# Patient Record
Sex: Female | Born: 1995 | Race: Black or African American | Hispanic: No | Marital: Single | Smoking: Never smoker
Health system: Southern US, Community
[De-identification: ages and names within clinical notes are randomized; demographics above are authoritative.]

## PROBLEM LIST (undated history)

## (undated) DIAGNOSIS — F419 Anxiety disorder, unspecified: Secondary | ICD-10-CM

## (undated) DIAGNOSIS — J45909 Unspecified asthma, uncomplicated: Secondary | ICD-10-CM

## (undated) HISTORY — PX: TONSILLECTOMY: SUR1361

## (undated) HISTORY — PX: HERNIA REPAIR: SHX51

## (undated) HISTORY — PX: ADENOIDECTOMY: SHX5191

## (undated) HISTORY — PX: HAND SURGERY: SHX662

## (undated) HISTORY — PX: TYMPANOSTOMY TUBE PLACEMENT: SHX32

---
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
## 2020-07-30 ENCOUNTER — Emergency Department (HOSPITAL_BASED_OUTPATIENT_CLINIC_OR_DEPARTMENT_OTHER)
Admission: EM | Admit: 2020-07-30 | Discharge: 2020-07-31 | Disposition: A | Discharge status: Home / Self Care | Payer: BC Managed Care – PPO | Attending: Emergency Medicine | Admitting: Emergency Medicine

## 2020-07-30 ENCOUNTER — Emergency Department (HOSPITAL_BASED_OUTPATIENT_CLINIC_OR_DEPARTMENT_OTHER): Payer: BC Managed Care – PPO

## 2020-07-30 ENCOUNTER — Other Ambulatory Visit: Payer: Self-pay

## 2020-07-30 ENCOUNTER — Encounter (HOSPITAL_BASED_OUTPATIENT_CLINIC_OR_DEPARTMENT_OTHER): Payer: Self-pay | Admitting: *Deleted

## 2020-07-30 DIAGNOSIS — J029 Acute pharyngitis, unspecified: Secondary | ICD-10-CM | POA: Diagnosis not present

## 2020-07-30 DIAGNOSIS — B370 Candidal stomatitis: Secondary | ICD-10-CM | POA: Diagnosis not present

## 2020-07-30 DIAGNOSIS — J45909 Unspecified asthma, uncomplicated: Secondary | ICD-10-CM | POA: Insufficient documentation

## 2020-07-30 DIAGNOSIS — H9203 Otalgia, bilateral: Secondary | ICD-10-CM | POA: Insufficient documentation

## 2020-07-30 LAB — CBC WITH DIFFERENTIAL/PLATELET
Abs Immature Granulocytes: 0.08 10*3/uL — ABNORMAL HIGH (ref 0.00–0.07)
Basophils Absolute: 0 10*3/uL (ref 0.0–0.1)
Basophils Relative: 0 %
Eosinophils Absolute: 0 10*3/uL (ref 0.0–0.5)
Eosinophils Relative: 0 %
HCT: 43.9 % (ref 36.0–46.0)
Hemoglobin: 14.6 g/dL (ref 12.0–15.0)
Immature Granulocytes: 1 %
Lymphocytes Relative: 26 %
Lymphs Abs: 4.1 10*3/uL — ABNORMAL HIGH (ref 0.7–4.0)
MCH: 28.4 pg (ref 26.0–34.0)
MCHC: 33.3 g/dL (ref 30.0–36.0)
MCV: 85.4 fL (ref 80.0–100.0)
Monocytes Absolute: 1.5 10*3/uL — ABNORMAL HIGH (ref 0.1–1.0)
Monocytes Relative: 9 %
Neutro Abs: 10.3 10*3/uL — ABNORMAL HIGH (ref 1.7–7.7)
Neutrophils Relative %: 64 %
Platelets: 315 10*3/uL (ref 150–400)
RBC: 5.14 MIL/uL — ABNORMAL HIGH (ref 3.87–5.11)
RDW: 14.4 % (ref 11.5–15.5)
WBC: 16 10*3/uL — ABNORMAL HIGH (ref 4.0–10.5)
nRBC: 0 % (ref 0.0–0.2)

## 2020-07-30 LAB — BASIC METABOLIC PANEL
Anion gap: 8 (ref 5–15)
BUN: 13 mg/dL (ref 6–20)
CO2: 26 mmol/L (ref 22–32)
Calcium: 9.3 mg/dL (ref 8.9–10.3)
Chloride: 101 mmol/L (ref 98–111)
Creatinine, Ser: 0.78 mg/dL (ref 0.44–1.00)
GFR, Estimated: >60 mL/min (ref >60)
Glucose, Bld: 84 mg/dL (ref 70–99)
Potassium: 3.9 mmol/L (ref 3.5–5.1)
Sodium: 135 mmol/L (ref 135–145)

## 2020-07-30 LAB — HCG, SERUM, QUALITATIVE: Preg, Serum: NEGATIVE

## 2020-07-30 LAB — MONONUCLEOSIS SCREEN: Mono Screen: NEGATIVE

## 2020-07-30 MED ORDER — LIDOCAINE VISCOUS HCL 2 % MT SOLN: 5.0000 mL | Freq: Three times a day (TID) | OROMUCOSAL | 0 refills | Status: AC

## 2020-07-30 MED ORDER — DEXAMETHASONE SODIUM PHOSPHATE 10 MG/ML IJ SOLN
10.0000 mg | Freq: Once | INTRAMUSCULAR | Status: AC
  Administered 2020-07-30: 10 mg via INTRAVENOUS
  Filled 2020-07-30: qty 1

## 2020-07-30 MED ORDER — ACETAMINOPHEN 325 MG PO TABS
650.0000 mg | ORAL_TABLET | Freq: Once | ORAL | Status: AC
  Administered 2020-07-30: 650 mg via ORAL
  Filled 2020-07-30: qty 2

## 2020-07-30 MED ORDER — CLINDAMYCIN PHOSPHATE 600 MG/50ML IV SOLN
600.0000 mg | Freq: Once | INTRAVENOUS | Status: AC
  Administered 2020-07-30: 600 mg via INTRAVENOUS
  Filled 2020-07-30: qty 50

## 2020-07-30 MED ORDER — FLUCONAZOLE 200 MG PO TABS: 200.0000 mg | ORAL_TABLET | Freq: Every day | ORAL | 0 refills | Status: AC

## 2020-07-30 MED ORDER — IOHEXOL 300 MG/ML  SOLN
75.0000 mL | Freq: Once | INTRAMUSCULAR | Status: AC | PRN
  Administered 2020-07-30: 75 mL via INTRAVENOUS

## 2020-07-30 MED ORDER — MORPHINE SULFATE (PF) 4 MG/ML IV SOLN
4.0000 mg | Freq: Once | INTRAVENOUS | Status: AC
  Administered 2020-07-30: 4 mg via INTRAVENOUS
  Filled 2020-07-30: qty 1

## 2020-07-30 MED ORDER — CLINDAMYCIN HCL 300 MG PO CAPS: 300.0000 mg | ORAL_CAPSULE | Freq: Three times a day (TID) | ORAL | 0 refills | Status: AC

## 2020-07-30 NOTE — ED Notes (Signed)
Pt. Has had Covid swab on Monday was negative

## 2020-07-30 NOTE — Discharge Instructions (Addendum)
Take the mediations as prescribed. Stop the Cefdinir   Return for new or worsening symptoms

## 2020-07-30 NOTE — ED Notes (Signed)
Pt. Had cell phone on when RN entered room RN asked Pt. To turn phone off and type on phone what was going on and the test that have been done.  Pt. Has been seen at [facility] on July 4 with strep negative and placed on abx still taking and pain meds.  Pt. Has no voice with gone and only able to take small sips of fluid.  Pt. Reports by typing feels like swallowing razor blades and fire.

## 2020-07-30 NOTE — ED Provider Notes (Signed)
[facility] EMERGENCY DEPARTMENT Provider Note   CSN: [id number] Arrival date & time: 07/30/20  1832     History Chief Complaint  Patient presents with   Sore Throat   Otalgia    Rhonda Mccoy is a 25 y.o. female with past medical history significant for asthma who presents for evaluation of sore throat.  Symptoms began on Monday.  Was seen by urgent care.  Had negative strep, COVID test.  Has had worsening pain.  Pain worse with tolerating p.o. intake.  She does not want to talk due to the pain.  She has had no neck stiffness or neck rigidity.  No shortness of breath, CP.  Was recently started a few weeks ago on inhaled corticosteroid for her asthma.  She has no concern for intraoral STDs.  She is s/p tonsillectomy.  Patient was started on cefdinir, steroids, viscous lidocaine. Noted white patches on back of throat. States she has not had relief with these medications.  She is taken these medications for 3 days.  Rates her pain a 9/10.  Denies additional aggravating associated or  Cabbell intact distally relieving factors.  She denies any drooling, dysphagia or trismus.  No facial swelling.  No fever, chills, nausea or vomiting. No sick contacts. Up to date on immunizations.  History obtained from patient and past medical records.  No interpreter used.  HPI     Past Medical History:  Diagnosis Date   Anxiety    Asthma     There are no problems to display for this patient.   Past Surgical History:  Procedure Laterality Date   ADENOIDECTOMY     HAND SURGERY     HERNIA REPAIR     TONSILLECTOMY     TYMPANOSTOMY TUBE PLACEMENT       OB History   No obstetric history on file.     No family history on file.  Social History   Tobacco Use   Smoking status: Never   Smokeless tobacco: Never  Vaping Use   Vaping Use: Never used  Substance Use Topics   Alcohol use: No   Drug use: No    Home Medications Prior to Admission medications   Medication Sig Start  Date End Date Taking? Authorizing Provider  clindamycin (CLEOCIN) 300 MG capsule Take 1 capsule (300 mg total) by mouth 3 (three) times daily for 7 days. 07/30/20 08/06/20 Yes Vicy Medico A, PA-C  fluconazole (DIFLUCAN) 200 MG tablet Take 1 tablet (200 mg total) by mouth daily for 7 days. 07/30/20 08/06/20 Yes Shellia Hartl A, PA-C  magic mouthwash (lidocaine, diphenhydrAMINE, alum & mag hydroxide) suspension Swish and swallow 5 mLs 3 (three) times daily. 07/30/20 08/29/20 Yes Darric Plante A, PA-C    Allergies    Amoxicillin and Penicillins  Review of Systems   Review of Systems  Constitutional: Negative.   HENT:  Positive for sore throat. Negative for congestion, drooling, ear discharge, facial swelling, hearing loss, mouth sores, nosebleeds, postnasal drip, rhinorrhea, sinus pressure, sinus pain, sneezing, trouble swallowing and voice change.   Respiratory: Negative.    Cardiovascular: Negative.   Gastrointestinal: Negative.   Genitourinary: Negative.   Musculoskeletal: Negative.   Skin: Negative.   Neurological: Negative.   All other systems reviewed and are negative.  Physical Exam Updated Vital Signs BP 113/63   Pulse 60   Temp 99 F (37.2 C) (Oral)   Resp 20   Ht 5' (1.524 m)   Wt 105.8 kg   LMP 07/03/2020  SpO2 100%   BMI 45.54 kg/m   Physical Exam Vitals and nursing note reviewed.  Constitutional:      General: She is not in acute distress.    Appearance: She is well-developed. She is not ill-appearing, toxic-appearing or diaphoretic.  HENT:     Head: Normocephalic and atraumatic.     Jaw: There is normal jaw occlusion.     Comments: No drooling, dysphagia or trismus  No facial edema.  Sub mandibular area soft    Right Ear: Tympanic membrane and ear canal normal.     Left Ear: Tympanic membrane and ear canal normal.     Nose: No congestion or rhinorrhea.     Mouth/Throat:     Lips: Pink.     Mouth: Mucous membranes are moist.     Palate: No mass and  lesions.     Pharynx: Uvula midline. Oropharyngeal exudate, posterior oropharyngeal erythema and uvula swelling present.     Tonsils: No tonsillar exudate or tonsillar abscesses. 0 on the right. 0 on the left.     Comments: Posterior oropharyngeal erythema with white, scrappable exudate to tongue as well as posterior oropharynx.  Uvula midline with some mild swelling however no deviation.  No evidence of PTA or RPA.  No visualized tonsils likely due to prior surgical intervention. Eyes:     Pupils: Pupils are equal, round, and reactive to light.  Neck:     Trachea: Trachea and phonation normal.     Comments: No neck stiffness or neck rigidity Cardiovascular:     Rate and Rhythm: Normal rate.     Pulses: Normal pulses.     Heart sounds: Normal heart sounds.  Pulmonary:     Effort: Pulmonary effort is normal. No respiratory distress.     Breath sounds: Normal breath sounds and air entry.  Abdominal:     General: There is no distension.  Musculoskeletal:        General: Normal range of motion.     Cervical back: Full passive range of motion without pain and normal range of motion.  Skin:    General: Skin is warm and dry.  Neurological:     General: No focal deficit present.     Mental Status: She is alert.  Psychiatric:        Mood and Affect: Mood normal.    ED Results / Procedures / Treatments   Labs (all labs ordered are listed, but only abnormal results are displayed) Labs Reviewed  CBC WITH DIFFERENTIAL/PLATELET - Abnormal; Notable for the following components:      Result Value   WBC 16.0 (*)    RBC 5.14 (*)    Neutro Abs 10.3 (*)    Lymphs Abs 4.1 (*)    Monocytes Absolute 1.5 (*)    Abs Immature Granulocytes 0.08 (*)    All other components within normal limits  BASIC METABOLIC PANEL  MONONUCLEOSIS SCREEN  HCG, SERUM, QUALITATIVE  GC/CHLAMYDIA PROBE AMP ([place]) NOT AT [facility]    EKG None  Radiology CT Soft Tissue Neck W Contrast  Result Date:  07/30/2020 CLINICAL DATA:  Initial evaluation for acute sore throat, bilateral ear pain. History of prior adenoidectomy, tonsillectomy. EXAM: CT NECK WITH CONTRAST TECHNIQUE: Multidetector CT imaging of the neck was performed using the standard protocol following the bolus administration of intravenous contrast. CONTRAST:  10mL OMNIPAQUE IOHEXOL 300 MG/ML  SOLN COMPARISON:  None. FINDINGS: Pharynx and larynx: Oral cavity within normal limits. No acute inflammatory changes seen  about the dentition. Oropharynx and nasopharynx demonstrate no acute finding. No retropharyngeal swelling or collection. Epiglottis normal. Vallecula clear. Remainder of the hypopharynx and supraglottic larynx within normal limits. Glottis closed and not well assessed. Subglottic airway patent clear. Salivary glands: Salivary glands including the parotid and submandibular glands are normal. Thyroid: Normal. Lymph nodes: No enlarged or pathologic adenopathy seen within the neck. Vascular: Normal intravascular enhancement seen throughout the neck. Limited intracranial: Unremarkable. Visualized orbits: Unremarkable. Mastoids and visualized paranasal sinuses: Visualized paranasal sinuses are clear. Visualized mastoid air cells and middle ear cavities are well pneumatized and free of fluid. Skeleton: No visible acute osseous finding. No discrete or worrisome osseous lesions. Upper chest: Visualized upper chest demonstrates no acute finding. Partially visualized lungs are clear. Other: None. IMPRESSION: Normal CT of the neck. No acute inflammatory changes or other abnormality identified. Electronically Signed   By: Rise MuBenjamin  McClintock M.D.   On: 07/30/2020 23:21    Procedures Procedures   Medications Ordered in ED Medications  acetaminophen (TYLENOL) tablet 650 mg (650 mg Oral Given 07/30/20 2110)  dexamethasone (DECADRON) injection 10 mg (10 mg Intravenous Given 07/30/20 2233)  morphine 4 MG/ML injection 4 mg (4 mg Intravenous Given 07/30/20  2233)  iohexol (OMNIPAQUE) 300 MG/ML solution 75 mL (75 mLs Intravenous Contrast Given 07/30/20 2248)  clindamycin (CLEOCIN) IVPB 600 mg (0 mg Intravenous Stopped 07/30/20 2338)    ED Course  I have reviewed the triage vital signs and the nursing notes.  Pertinent labs & imaging results that were available during my care of the patient were reviewed by me and considered in my medical decision making (see chart for details).  Here for evaluation of sore throat.  She is afebrile, nonseptic, non-ill-appearing.  No evidence of PTA or RPA.  She has no neck stiffness or neck rigidity.  Sublingual area soft.  Submandibular area without induration.  No evidence of Ludwig's angina.  She is s/p tonsillectomy many years ago.  Does have some mild uvula erythema and swelling however no deviation.  Has copious, thick white scrappable plaque to tongue as well as posterior oropharynx.  She is up-to-date immunizations, no recent travel.  Low suspicion for diphtheria. She denies any concerns for any intraoral STDs.  She is tolerating her secretions.  Strep, COVID flu outpatient negative  CBC with leukocytosis at 16.0, patient has been on steroids recently Mono negative Pregnancy test negative BMP without electrolyte, renal abnormality CT soft tissue neck without infectious process.  Unclear etiology of symptoms.  Will treat symptomatically.  Given she did recently start a few weeks ago inhaled corticosteroids, question if patient has thrush.  Will treat symptomatically and have her follow-up with ENT. Tolerating PO intake.  The patient has been appropriately medically screened and/or stabilized in the ED. I have low suspicion for any other emergent medical condition which would require further screening, evaluation or treatment in the ED or require inpatient management.  Patient is hemodynamically stable and in no acute distress.  Patient able to ambulate in department prior to ED.  Evaluation does not show acute  pathology that would require ongoing or additional emergent interventions while in the emergency department or further inpatient treatment.  I have discussed the diagnosis with the patient and answered all questions.  Pain is been managed while in the emergency department and patient has no further complaints prior to discharge.  Patient is comfortable with plan discussed in room and is stable for discharge at this time.  I have discussed strict return  precautions for returning to the emergency department.  Patient was encouraged to follow-up with PCP/specialist refer to at discharge.     MDM Rules/Calculators/A&P                           Final Clinical Impression(s) / ED Diagnoses Final diagnoses:  Sore throat  Oral yeast infection    Rx / DC Orders ED Discharge Orders          Ordered    clindamycin (CLEOCIN) 300 MG capsule  3 times daily        07/30/20 2359    magic mouthwash (lidocaine, diphenhydrAMINE, alum & mag hydroxide) suspension  3 times daily        07/30/20 2359    fluconazole (DIFLUCAN) 200 MG tablet  Daily        07/30/20 2359             Socrates Cahoon A, PA-C 07/31/20 0008    Benjiman Core, MD 07/31/20 1453

## 2020-07-30 NOTE — ED Triage Notes (Signed)
Pain in both of her ears and sore throat with exudate. She was seen by her MD x 2 with no relief. Flu and strep test are negative. Her Covid test is pending. She has been given antibiotics and pain pills. Lidocaine viscous is being used.

## 2020-07-31 ENCOUNTER — Telehealth (HOSPITAL_BASED_OUTPATIENT_CLINIC_OR_DEPARTMENT_OTHER): Payer: Self-pay | Admitting: *Deleted

## 2020-07-31 NOTE — Telephone Encounter (Signed)
Cytology called [facility] to report that GC/chamydia throat swab had been sent to them in wrong tube and they are unable to run test.   Called pt to inform her that she needs to return to ed or go to an urgent care to have these test repeated

## 2022-03-18 ENCOUNTER — Encounter (HOSPITAL_BASED_OUTPATIENT_CLINIC_OR_DEPARTMENT_OTHER): Payer: Self-pay | Admitting: Urology

## 2022-03-18 ENCOUNTER — Other Ambulatory Visit: Payer: Self-pay

## 2022-03-18 ENCOUNTER — Emergency Department (HOSPITAL_BASED_OUTPATIENT_CLINIC_OR_DEPARTMENT_OTHER)
Admission: EM | Admit: 2022-03-18 | Discharge: 2022-03-18 | Disposition: A | Discharge status: Home / Self Care | Payer: BC Managed Care – PPO | Attending: Emergency Medicine | Admitting: Emergency Medicine

## 2022-03-18 DIAGNOSIS — K047 Periapical abscess without sinus: Secondary | ICD-10-CM | POA: Insufficient documentation

## 2022-03-18 DIAGNOSIS — K029 Dental caries, unspecified: Secondary | ICD-10-CM | POA: Insufficient documentation

## 2022-03-18 MED ORDER — METRONIDAZOLE 500 MG PO TABS: 500.0000 mg | ORAL_TABLET | Freq: Two times a day (BID) | ORAL | 0 refills | Status: AC

## 2022-03-18 MED ORDER — HYDROCODONE-ACETAMINOPHEN 5-325 MG PO TABS: 1.0000 | ORAL_TABLET | Freq: Four times a day (QID) | ORAL | 0 refills | Status: AC | PRN

## 2022-03-18 MED ORDER — CEFDINIR 300 MG PO CAPS: 300.0000 mg | ORAL_CAPSULE | Freq: Two times a day (BID) | ORAL | 0 refills | Status: AC

## 2022-03-18 NOTE — ED Provider Notes (Signed)
[facility] Provider Note   CSN: [id number] Arrival date & time: 03/18/22  1239     History  Chief Complaint  Patient presents with   Dental Pain    Rhonda Mccoy is a 27 y.o. female, no pertinent past medical history, who presents to the ED secondary to right molar pain that is been going on for the last week and a half.  She states that she has had a little bit of bleeding of her mouth, and the tooth pain.  States that she tried to get into her dentist, but they will not see her until 3/14.  Denying any redness, does endorse a little bit of swelling of her right cheek.  Denies any ear pain, difficulty swallowing, or shortness of breath.    Home Medications Prior to Admission medications   Medication Sig Start Date End Date Taking? Authorizing Provider  cefdinir (OMNICEF) 300 MG capsule Take 1 capsule (300 mg total) by mouth 2 (two) times daily. 03/18/22  Yes Jackie Littlejohn L, PA  HYDROcodone-acetaminophen (NORCO) 5-325 MG tablet Take 1 tablet by mouth every 6 (six) hours as needed for moderate pain. 03/18/22  Yes Lakrista Scaduto L, PA  metroNIDAZOLE (FLAGYL) 500 MG tablet Take 1 tablet (500 mg total) by mouth 2 (two) times daily. 03/18/22  Yes Kinslee Dalpe L, PA      Allergies    Amoxicillin and Penicillins    Review of Systems   Review of Systems  HENT:  Positive for dental problem. Negative for ear pain and sore throat.     Physical Exam Updated Vital Signs BP (!) 129/91 (BP Location: Right Arm)   Pulse 63   Temp 99 F (37.2 C) (Oral)   Resp 18   Ht 5' (1.524 m)   Wt 103 kg   SpO2 97%   BMI 44.33 kg/m  Physical Exam Vitals and nursing note reviewed.  Constitutional:      General: She is not in acute distress.    Appearance: She is well-developed. She is not toxic-appearing.  HENT:     Head: Normocephalic and atraumatic.     Right Ear: Tympanic membrane normal. Tympanic membrane is not perforated, erythematous, retracted  or bulging.     Left Ear: Tympanic membrane normal. Tympanic membrane is not perforated, erythematous, retracted or bulging.     Nose: Nose normal.     Mouth/Throat:     Mouth: Mucous membranes are moist.     Dentition: Dental caries present.     Pharynx: Uvula midline. No oropharyngeal exudate, posterior oropharyngeal erythema or uvula swelling.     Tonsils: No tonsillar abscesses.      Comments: Carie as marked above, found and right molar.  Mild swelling of the gingiva, with erythema.  No fluctuance however.   Eyes:     General:        Right eye: No discharge.        Left eye: No discharge.     Conjunctiva/sclera: Conjunctivae normal.  Pulmonary:     Effort: No respiratory distress.  Musculoskeletal:     Cervical back: Normal range of motion and neck supple.  Lymphadenopathy:     Cervical: No cervical adenopathy.  Neurological:     Mental Status: She is alert.     Comments: Clear speech.   Psychiatric:        Behavior: Behavior normal.        Thought Content: Thought content normal.  ED Results / Procedures / Treatments   Labs (all labs ordered are listed, but only abnormal results are displayed) Labs Reviewed - No data to display  EKG None  Radiology No results found.  Procedures Procedures    Medications Ordered in ED Medications - No data to display  ED Course/ Medical Decision Making/ A&P                             Medical Decision Making Patient here for a dental pain that is been going on for the last week and a half.  Found to have likely carious in the right second molar, some irritation redness around the gums, no overt facial swelling.  Uvula midline.  No facial erythema.  I believe that she has a brewing dental infection secondary to her carie we will start her on a cephalosporin and metronidazole given that she has an infection and is allergic to penicillins.  She states she has a rash with penicillins.  Discussed return precautions, and sent a  short dose of Norco for severe pain as she has been taking Tylenol around every 3 hours without relief.  Discussed importance of only using 4 g Tylenol a day total.  Risk Prescription drug management.    Final Clinical Impression(s) / ED Diagnoses Final diagnoses:  Dental infection  Pain due to dental caries    Rx / DC Orders ED Discharge Orders          Ordered    HYDROcodone-acetaminophen (NORCO) 5-325 MG tablet  Every 6 hours PRN        03/18/22 1400    cefdinir (OMNICEF) 300 MG capsule  2 times daily        03/18/22 1400    metroNIDAZOLE (FLAGYL) 500 MG tablet  2 times daily        03/18/22 1400              Willodean Leven L, PA 03/18/22 1412    Leanord Asal K, DO 03/18/22 1428

## 2022-03-18 NOTE — Discharge Instructions (Addendum)
Please follow-up with your dentist, in the next week, I provided you with information for other resources for dental providers.  Return to the ER if you have swelling of your face, redness, difficulty swallowing.

## 2022-03-18 NOTE — ED Triage Notes (Signed)
Right lower dental pain and mild facial swelling x 3 days  Denies fever  States has dentist appointment on 3/14 but unable to be seen sooner

## 2023-01-08 IMAGING — CT CT NECK W/ CM
3 of 4 series · 13 of 33 positions shown, 16 images · IV contrast (omnipaque)
Comparison: None.

CLINICAL DATA: Initial evaluation for acute sore throat, bilateral
ear pain. History of prior adenoidectomy, tonsillectomy.

EXAM:
CT NECK WITH CONTRAST
TECHNIQUE: Multidetector CT imaging of the neck was performed using the
standard protocol following the bolus administration of intravenous
contrast.
CONTRAST:  75mL OMNIPAQUE IOHEXOL 300 MG/ML  SOLN

[Series 3: axial neck · axial · 0.46mm/px · z∈[-640,-512]mm · 5 of 97 slices shown, 7 images]
[im 17/97  soft-tissue]
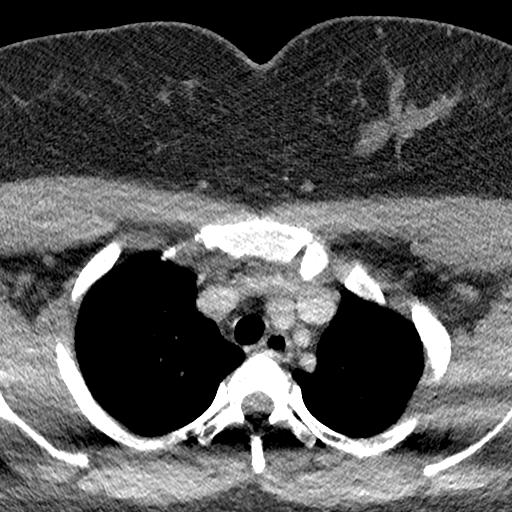
[im 17/97  bone]
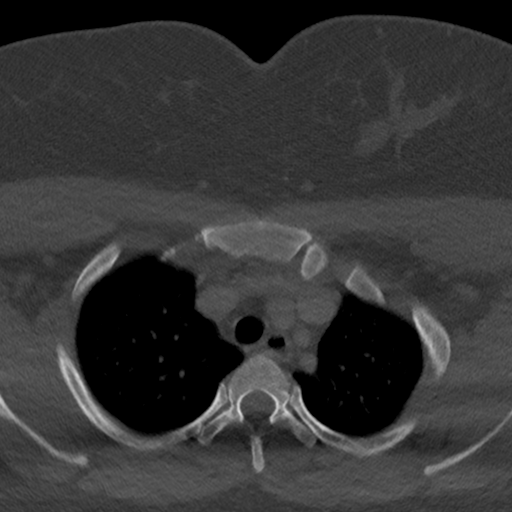
[im 33/97  bone]
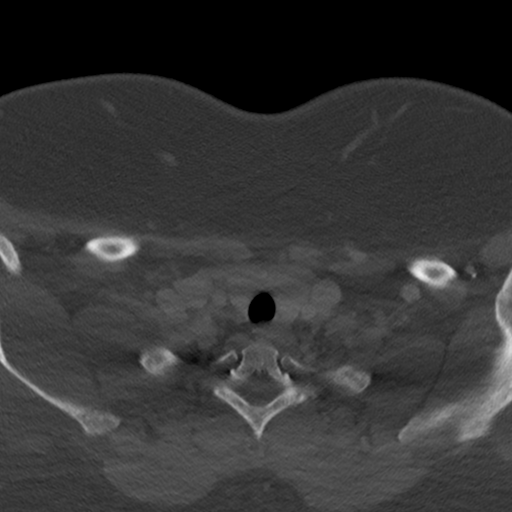
[im 49/97  bone]
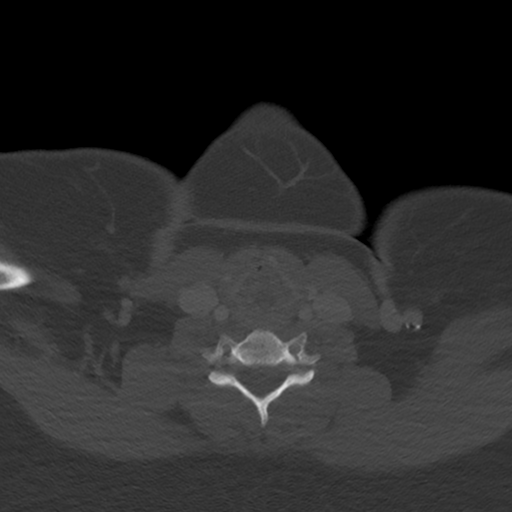
[im 65/97  bone]
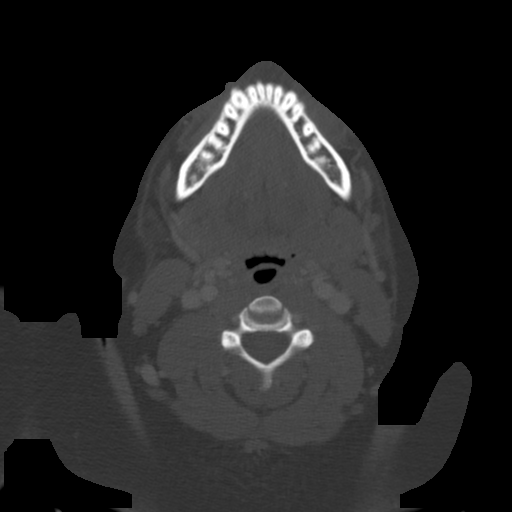
[im 81/97  soft-tissue]
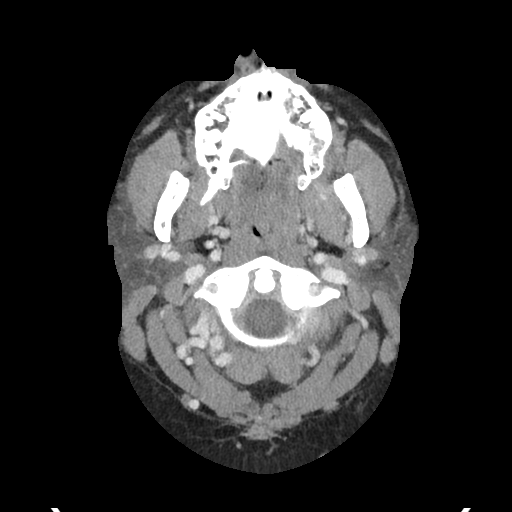
[im 81/97  bone]
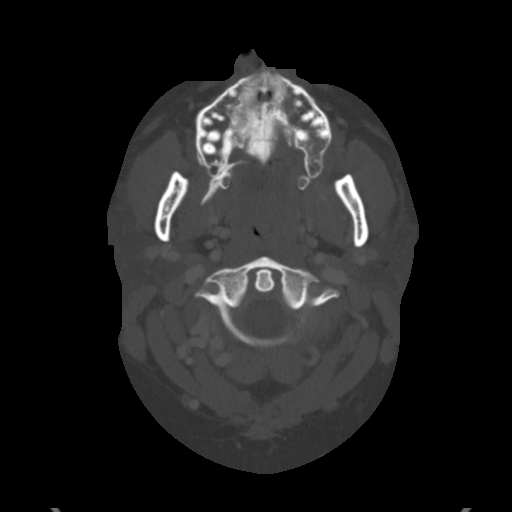

[Series 6: sag neck · sagittal · 0.39mm/px · 5 of 101 slices shown, 6 images]
[im 34/101  bone]
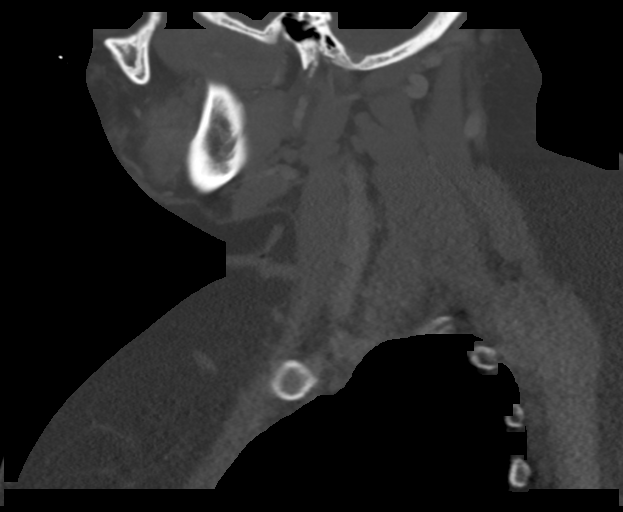
[im 42/101  bone]
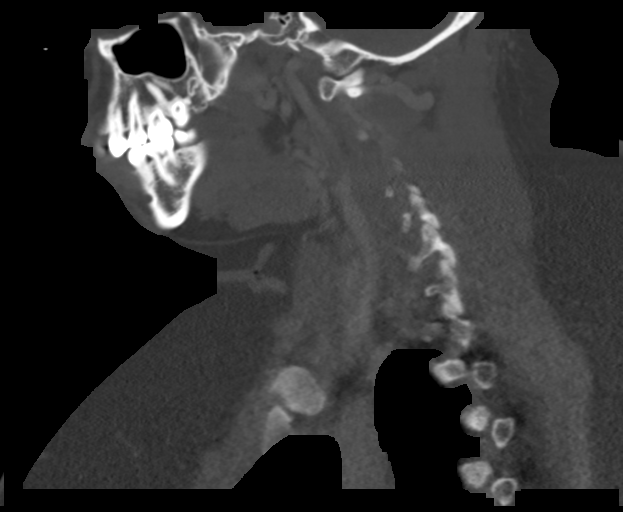
[im 51/101  soft-tissue]
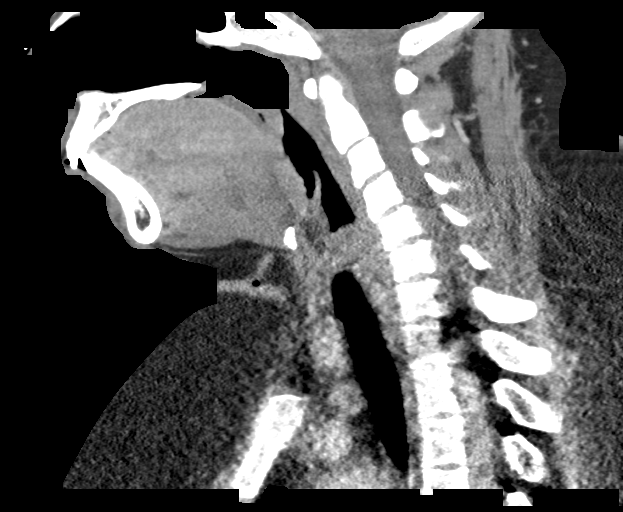
[im 51/101  bone]
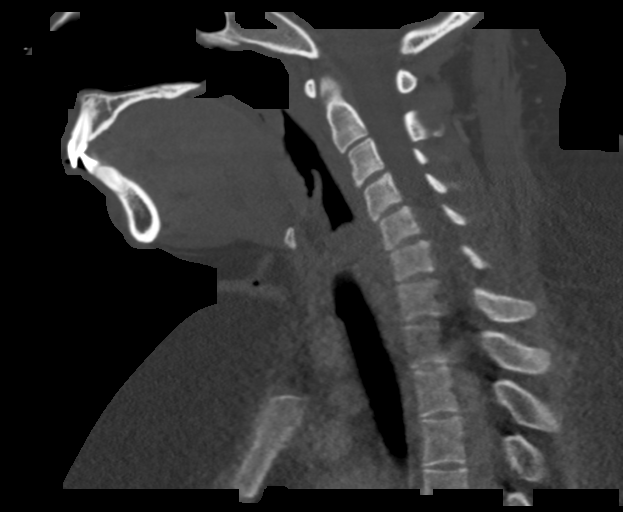
[im 59/101  bone]
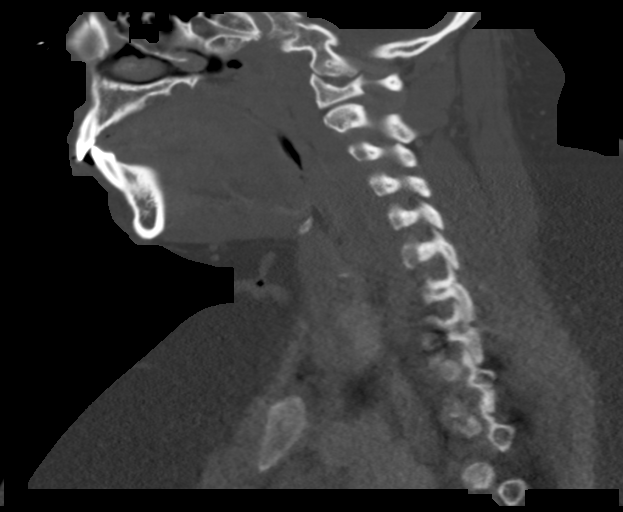
[im 67/101  bone]
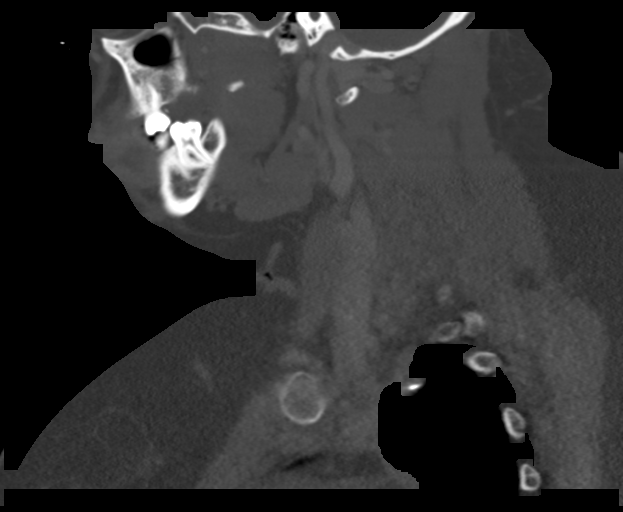

[Series 7: cor neck · coronal · 0.38mm/px · 3 of 118 slices shown]
[im 24/118  bone]
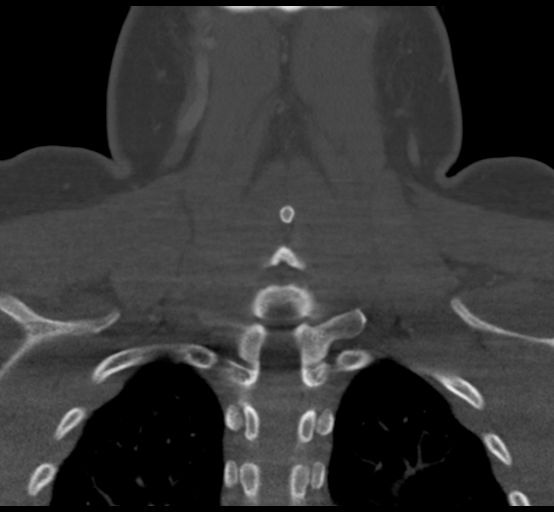
[im 47/118  bone]
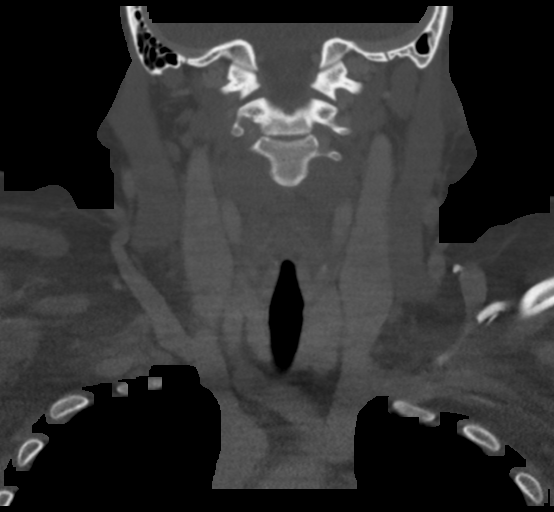
[im 71/118  bone]
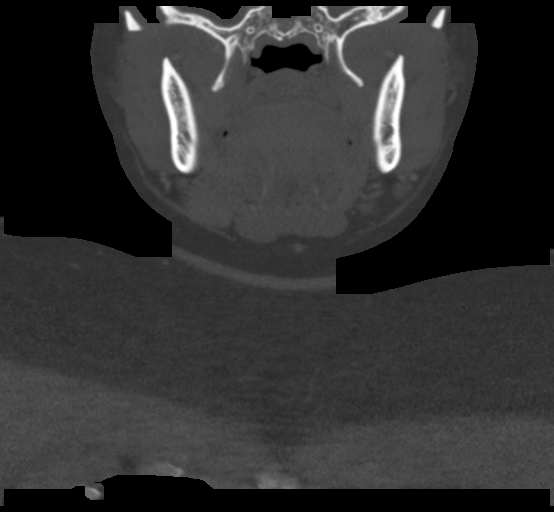

[13 of 33 positions shown; findings below may reference images not displayed]

FINDINGS: Pharynx and larynx: Oral cavity within normal limits. No acute
inflammatory changes seen about the dentition. Oropharynx and
nasopharynx demonstrate no acute finding. No retropharyngeal
swelling or collection. Epiglottis normal. Vallecula clear.
Remainder of the hypopharynx and supraglottic larynx within normal
limits. Glottis closed and not well assessed. Subglottic airway
patent clear.

Salivary glands: Salivary glands including the parotid and
submandibular glands are normal.

Thyroid: Normal.

Lymph nodes: No enlarged or pathologic adenopathy seen within the
neck.

Vascular: Normal intravascular enhancement seen throughout the neck.

Limited intracranial: Unremarkable.

Visualized orbits: Unremarkable.

Mastoids and visualized paranasal sinuses: Visualized paranasal
sinuses are clear. Visualized mastoid air cells and middle ear
cavities are well pneumatized and free of fluid.

Skeleton: No visible acute osseous finding. No discrete or worrisome
osseous lesions.

Upper chest: Visualized upper chest demonstrates no acute finding.
Partially visualized lungs are clear.

Other: None.
IMPRESSION: Normal CT of the neck. No acute inflammatory changes or other
abnormality identified.

## 2023-06-07 ENCOUNTER — Encounter (HOSPITAL_BASED_OUTPATIENT_CLINIC_OR_DEPARTMENT_OTHER): Payer: Self-pay | Admitting: Emergency Medicine

## 2023-06-07 ENCOUNTER — Emergency Department (HOSPITAL_BASED_OUTPATIENT_CLINIC_OR_DEPARTMENT_OTHER)
Admission: EM | Admit: 2023-06-07 | Discharge: 2023-06-07 | Disposition: A | Discharge status: Home / Self Care | Attending: Emergency Medicine | Admitting: Emergency Medicine

## 2023-06-07 ENCOUNTER — Other Ambulatory Visit: Payer: Self-pay

## 2023-06-07 ENCOUNTER — Emergency Department (HOSPITAL_BASED_OUTPATIENT_CLINIC_OR_DEPARTMENT_OTHER)

## 2023-06-07 DIAGNOSIS — J45909 Unspecified asthma, uncomplicated: Secondary | ICD-10-CM | POA: Insufficient documentation

## 2023-06-07 DIAGNOSIS — R6884 Jaw pain: Secondary | ICD-10-CM | POA: Diagnosis present

## 2023-06-07 DIAGNOSIS — W1809XA Striking against other object with subsequent fall, initial encounter: Secondary | ICD-10-CM | POA: Diagnosis not present

## 2023-06-07 DIAGNOSIS — S0083XA Contusion of other part of head, initial encounter: Secondary | ICD-10-CM | POA: Diagnosis not present

## 2023-06-07 LAB — PREGNANCY, URINE: Preg Test, Ur: NEGATIVE

## 2023-06-07 MED ORDER — IBUPROFEN 600 MG PO TABS: 600.0000 mg | ORAL_TABLET | Freq: Four times a day (QID) | ORAL | 0 refills | Status: AC | PRN

## 2023-06-07 MED ORDER — IBUPROFEN 800 MG PO TABS
800.0000 mg | ORAL_TABLET | Freq: Once | ORAL | Status: DC
  Filled 2023-06-07: qty 1

## 2023-06-07 MED ORDER — IBUPROFEN 100 MG/5ML PO SUSP
600.0000 mg | Freq: Once | ORAL | Status: AC
  Administered 2023-06-07: 600 mg via ORAL
  Filled 2023-06-07: qty 30

## 2023-06-07 NOTE — ED Provider Notes (Signed)
 [place] [facility] Provider Note   CSN: [id number] Arrival date & time: 06/07/23  1539     History  Chief Complaint  Patient presents with   Jaw Pain    Rhonda Mccoy is a 28 y.o. female.  The history is provided by the patient. No language interpreter was used.     28 year old female history of anxiety, asthma, presenting for evaluation of facial injury.  Patient report yesterday she slipped on her slippery socks and fell forward striking her face against a coffee table.  She denies any loss of consciousness but this morning she noticed pain to the left side of her jaw that radiates towards her left ear.  Pain is sharp shooting worse with movement.  She does not endorse any headache no confusion no neck pain no nausea no vomiting no looseness of her teeth and no abnormal bleeding.  She did take some Tylenol  earlier in the day without adequate relief.  She denies any other injury.  Home Medications Prior to Admission medications   Medication Sig Start Date End Date Taking? Authorizing Provider  cefdinir  (OMNICEF ) 300 MG capsule Take 1 capsule (300 mg total) by mouth 2 (two) times daily. 03/18/22   Small, Brooke L, PA  HYDROcodone -acetaminophen  (NORCO) 5-325 MG tablet Take 1 tablet by mouth every 6 (six) hours as needed for moderate pain. 03/18/22   Small, Brooke L, PA  metroNIDAZOLE  (FLAGYL ) 500 MG tablet Take 1 tablet (500 mg total) by mouth 2 (two) times daily. 03/18/22   Small, Brooke L, PA      Allergies    Amoxicillin and Penicillins    Review of Systems   Review of Systems  All other systems reviewed and are negative.   Physical Exam Updated Vital Signs BP 122/77 (BP Location: Left Arm)   Pulse 97   Temp 98.4 F (36.9 C) (Oral)   Resp 16   Ht 5' (1.524 m)   Wt 105.2 kg   LMP 05/17/2023 (Approximate)   SpO2 100%   BMI 45.31 kg/m  Physical Exam Vitals and nursing note reviewed.  Constitutional:      General: She is not in  acute distress.    Appearance: She is well-developed.  HENT:     Head: Atraumatic.     Mouth/Throat:     Comments: Tenderness to palpation of the left jawline.  Mandible is stable, no malocclusion, no bruising no laceration no battle signs or raccoon's eye  No dental tenderness dental extrusion or intrusion. Eyes:     Conjunctiva/sclera: Conjunctivae normal.  Pulmonary:     Effort: Pulmonary effort is normal.  Musculoskeletal:     Cervical back: Normal range of motion and neck supple.  Skin:    Findings: No rash.  Neurological:     Mental Status: She is alert. Mental status is at baseline.  Psychiatric:        Mood and Affect: Mood normal.     ED Results / Procedures / Treatments   Labs (all labs ordered are listed, but only abnormal results are displayed) Labs Reviewed  PREGNANCY, URINE    EKG None  Radiology No results found.  Procedures Procedures    Medications Ordered in ED Medications  ibuprofen (ADVIL) 100 MG/5ML suspension 600 mg (has no administration in time range)    ED Course/ Medical Decision Making/ A&P  Medical Decision Making Amount and/or Complexity of Data Reviewed Labs: ordered. Radiology: ordered.  Risk Prescription drug management.   BP 122/77 (BP Location: Left Arm)   Pulse 97   Temp 98.4 F (36.9 C) (Oral)   Resp 16   Ht 5' (1.524 m)   Wt 105.2 kg   LMP 05/17/2023 (Approximate)   SpO2 100%   BMI 45.31 kg/m   Rhonda Mccoy:61 PM  28 year old female history of anxiety, asthma, presenting for evaluation of facial injury.  Patient report yesterday she slipped on her slippery socks and fell forward striking her face against a coffee table.  She denies any loss of consciousness but this morning she noticed pain to the left side of her jaw that radiates towards her left ear.  Pain is sharp shooting worse with movement.  She does not endorse any headache no confusion no neck pain no nausea no vomiting no  looseness of her teeth and no abnormal bleeding.  She did take some Tylenol  earlier in the day without adequate relief.  She denies any other injury.  On exam, patient has tenderness to left jaw line but her mandible appears to be well aligned and I do not appreciate any overlying skin changes no laceration no bruising.  No dental injury no other injury noted.  Given the mechanism of injury and increasing pain with jaw movement, will obtain maxillofacial CT for further assessment.  Ibuprofen given for pain.  Will check pregnancy test.  Patient otherwise mentating appropriately I do not identify any other concerning injury.  Pregnancy test is negative.  Maxillofacial CT scan has been obtained independently viewed inter by me and show no obvious signs of injury no acute bony fracture.  Radiologist have not read the CT yet.  Patient signed out to oncoming provider who will follow-up on the results and determine disposition.  Anticipate discharging home with anti-inflammatory medications for likely jaw contusion.  If patient suffered jaw fracture, ENT referral will be given as needed.        Final Clinical Impression(s) / ED Diagnoses Final diagnoses:  Contusion of jaw, initial encounter    Rx / DC Orders ED Discharge Orders          Ordered    ibuprofen (ADVIL) 600 MG tablet  Every 6 hours PRN        06/07/23 1735              Debbra Fairy, PA-C 06/07/23 1800    Mordecai Applebaum, MD 06/08/23 [phone]

## 2023-06-07 NOTE — Discharge Instructions (Addendum)
 You have been evaluated for your symptoms.  Fortunately your CT scan today did not show any evidence of any broken bones.  Your pain is likely due to a jaw contusion.  Follow instruction below for care.  Take ibuprofen as needed for pain.

## 2023-06-07 NOTE — ED Provider Notes (Signed)
 Signout from [place], PA-C at shift change. Briefly, patient presents for facial injury.  Fell and hit her jaw on a coffee table.  No LOC.  Woke up today with pain on the left side of her jaw.,  Worse with movement.  At time of signout, CT maxillofacial is pending.     6:04 PM Reassessment performed. Patient endorses tenderness to left mandible, she has no trismus, tolerating secretions, no evidence of dental injury, no tenderness to the soft tissue of her neck. CT maxillofacial negative.  Suitable for discharge.  Encouraged to use Tylenol  and ibuprofen as well as ice.  If symptoms persist she will follow-up with her primary care doctor.     Most current vital signs reviewed and are as follows: BP 122/77 (BP Location: Left Arm)   Pulse 97   Temp 98.4 F (36.9 C) (Oral)   Resp 16   Ht 5' (1.524 m)   Wt 105.2 kg   LMP 05/17/2023 (Approximate)   SpO2 100%   BMI 45.31 kg/m    Patient will be discharged home. The patient has been appropriately medically screened and/or stabilized in the ED. I have low suspicion for any other emergent medical condition which would require further screening, evaluation or treatment in the ED or require inpatient management. At time of discharge the patient is hemodynamically stable and in no acute distress. I have discussed work-up results and diagnosis with patient and answered all questions. Patient is agreeable with discharge plan. We discussed strict return precautions for returning to the emergency department and they verbalized understanding.      Felicie Horning, PA-C 06/07/23 1814    Mordecai Applebaum, MD 06/08/23 [phone]

## 2023-06-07 NOTE — ED Notes (Signed)
 Reviewed discharge instructions and recommendations wit pt. Pt states understanding.Ambulatory at discharge

## 2023-06-07 NOTE — ED Triage Notes (Signed)
 Pt POV steady gait- reports fall yesterday onto coffee table, denies LOC, blood thinners.  Today c/o L jaw pain, with spasm up to L ear. Tolerating secretions. Denies dental injury.   Took tylenol  appx 1000 today, no relief.
